# Patient Record
Sex: Female | Born: 1995 | Hispanic: Yes | Marital: Single
Health system: Southern US, Community
[De-identification: ages and names within clinical notes are randomized; demographics above are authoritative.]

---

## 2020-07-05 ENCOUNTER — Other Ambulatory Visit: Payer: Self-pay

## 2021-11-12 ENCOUNTER — Encounter (HOSPITAL_COMMUNITY): Payer: Self-pay | Admitting: Emergency Medicine

## 2021-11-12 ENCOUNTER — Emergency Department (HOSPITAL_COMMUNITY): Payer: Medicaid Other

## 2021-11-12 ENCOUNTER — Other Ambulatory Visit: Payer: Self-pay

## 2021-11-12 ENCOUNTER — Emergency Department (HOSPITAL_COMMUNITY)
Admission: EM | Admit: 2021-11-12 | Discharge: 2021-11-13 | Discharge status: Against Medical Advice | Payer: Medicaid Other | Attending: Emergency Medicine | Admitting: Emergency Medicine

## 2021-11-12 DIAGNOSIS — R059 Cough, unspecified: Secondary | ICD-10-CM | POA: Diagnosis not present

## 2021-11-12 DIAGNOSIS — M25511 Pain in right shoulder: Secondary | ICD-10-CM | POA: Insufficient documentation

## 2021-11-12 DIAGNOSIS — Z5321 Procedure and treatment not carried out due to patient leaving prior to being seen by health care provider: Secondary | ICD-10-CM | POA: Diagnosis not present

## 2021-11-12 DIAGNOSIS — R1011 Right upper quadrant pain: Secondary | ICD-10-CM | POA: Insufficient documentation

## 2021-11-12 LAB — COMPREHENSIVE METABOLIC PANEL
ALT: 12 U/L (ref 0–44)
AST: 17 U/L (ref 15–41)
Albumin: 3.4 g/dL — ABNORMAL LOW (ref 3.5–5.0)
Alkaline Phosphatase: 84 U/L (ref 38–126)
Anion gap: 8 (ref 5–15)
BUN: 8 mg/dL (ref 6–20)
CO2: 23 mmol/L (ref 22–32)
Calcium: 8.8 mg/dL — ABNORMAL LOW (ref 8.9–10.3)
Chloride: 106 mmol/L (ref 98–111)
Creatinine, Ser: 0.71 mg/dL (ref 0.44–1.00)
GFR, Estimated: >60 mL/min (ref >60)
Glucose, Bld: 111 mg/dL — ABNORMAL HIGH (ref 70–99)
Potassium: 3.1 mmol/L — ABNORMAL LOW (ref 3.5–5.1)
Sodium: 137 mmol/L (ref 135–145)
Total Bilirubin: 1 mg/dL (ref 0.3–1.2)
Total Protein: 7.1 g/dL (ref 6.5–8.1)

## 2021-11-12 LAB — CBC WITH DIFFERENTIAL/PLATELET
Abs Immature Granulocytes: 0.05 10*3/uL (ref 0.00–0.07)
Basophils Absolute: 0.1 10*3/uL (ref 0.0–0.1)
Basophils Relative: 0 %
Eosinophils Absolute: 0.3 10*3/uL (ref 0.0–0.5)
Eosinophils Relative: 2 %
HCT: 38.7 % (ref 36.0–46.0)
Hemoglobin: 12.6 g/dL (ref 12.0–15.0)
Immature Granulocytes: 0 %
Lymphocytes Relative: 18 %
Lymphs Abs: 2.3 10*3/uL (ref 0.7–4.0)
MCH: 28.8 pg (ref 26.0–34.0)
MCHC: 32.6 g/dL (ref 30.0–36.0)
MCV: 88.4 fL (ref 80.0–100.0)
Monocytes Absolute: 0.7 10*3/uL (ref 0.1–1.0)
Monocytes Relative: 6 %
Neutro Abs: 9.4 10*3/uL — ABNORMAL HIGH (ref 1.7–7.7)
Neutrophils Relative %: 74 %
Platelets: 293 10*3/uL (ref 150–400)
RBC: 4.38 MIL/uL (ref 3.87–5.11)
RDW: 14.5 % (ref 11.5–15.5)
WBC: 12.9 10*3/uL — ABNORMAL HIGH (ref 4.0–10.5)
nRBC: 0 % (ref 0.0–0.2)

## 2021-11-12 LAB — LIPASE, BLOOD: Lipase: 23 U/L (ref 11–51)

## 2021-11-12 LAB — I-STAT BETA HCG BLOOD, ED (MC, WL, AP ONLY): I-stat hCG, quantitative: <5 m[IU]/mL (ref <5)

## 2021-11-12 NOTE — ED Triage Notes (Signed)
Pt disclosed that she smoked marijuana PTA.  ?

## 2021-11-12 NOTE — ED Provider Triage Note (Signed)
Emergency Medicine Provider Triage Evaluation Note ? ?Julie Singh , a 26 y.o. female  was evaluated in triage.  Pt complains of pain in RUQ/right lower ribs.  States ongoing since this morning but worsening.  Pain worse with movement, deep breathing, coughing, etc.  She denies any vomiting.  States she is never had issues like this before.  She denies any injury.  Also reports coughed up blood this morning.  States she smoked weed earlier because she was in such bad pain. ? ?Review of Systems  ?Positive: RUQ pain, right lower rib pain ?Negative: fever ? ?Physical Exam  ?BP (!) 114/93 (BP Location: Right Arm)   Pulse 78   Temp 98.1 ?F (36.7 ?C) (Oral)   Resp 16   SpO2 99%  ?Gen:   Awake, no distress   ?Resp:  Normal effort  ?MSK:   Moves extremities without difficulty  ?Other:  Tender along right lower ribs, some mild tenderness in RUQ as well-- difficult exam as she remained hunched over. ? ?Medical Decision Making  ?Medically screening exam initiated at 10:20 PM.  Appropriate orders placed.  Vashawn Riggen was informed that the remainder of the evaluation will be completed by another provider, this initial triage assessment does not replace that evaluation, and the importance of remaining in the ED until their evaluation is complete. ? ?RUQ/right lower rib pain.  Exam difficult in triage as she remains hunched over in chair.  Will check labs, [place], rib films. ?  ?Larene Pickett, PA-C ?11/12/21 2223 ? ?

## 2021-11-12 NOTE — ED Triage Notes (Signed)
Pt reported to ED with c/o pain to RUQ abdomen under the last rib. Pt stats pain increases with movement. Pt states she is also having pain to right shoulder, has hx of frequent dislocations. Pt also states she has blood in phlegm.  ?

## 2021-11-13 LAB — URINALYSIS, ROUTINE W REFLEX MICROSCOPIC
Bilirubin Urine: NEGATIVE
Glucose, UA: NEGATIVE mg/dL
Hgb urine dipstick: NEGATIVE
Ketones, ur: NEGATIVE mg/dL
Nitrite: NEGATIVE
Protein, ur: NEGATIVE mg/dL
Specific Gravity, Urine: 1.021 (ref 1.005–1.030)
pH: 6 (ref 5.0–8.0)

## 2021-11-13 NOTE — ED Notes (Signed)
Patient left on own accord °

## 2023-05-22 IMAGING — US US ABDOMEN LIMITED
1 series · 14 of 25 positions shown · non-contrast
Comparison: None Available.

CLINICAL DATA: Right upper quadrant abdominal pain

EXAM:
ULTRASOUND ABDOMEN LIMITED RIGHT UPPER QUADRANT

[Series 1: us abdomen limited ruq (liver/gb) · 14 of 62 slices shown]
[im 1/62]
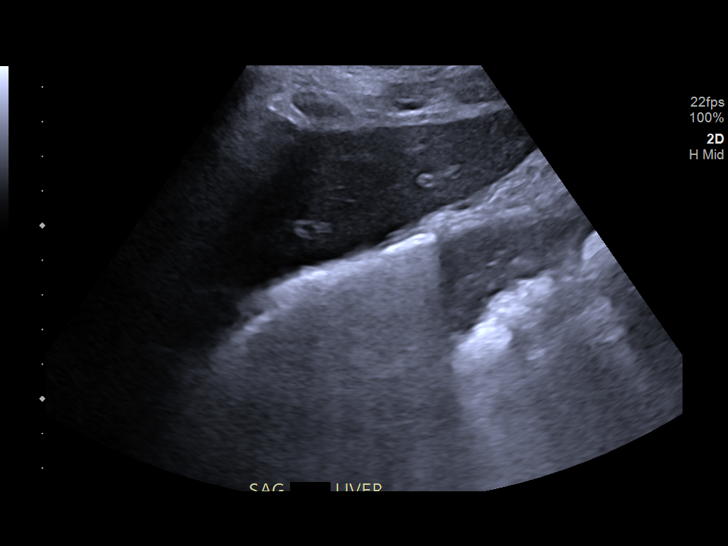
[im 6/62]
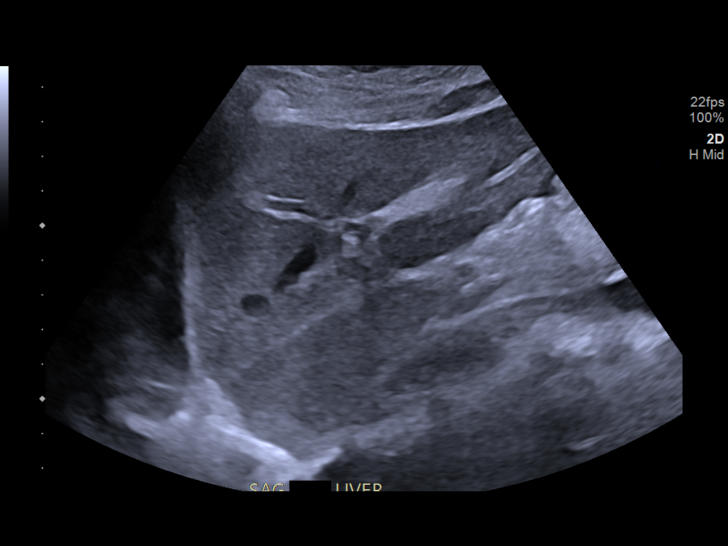
[im 11/62]
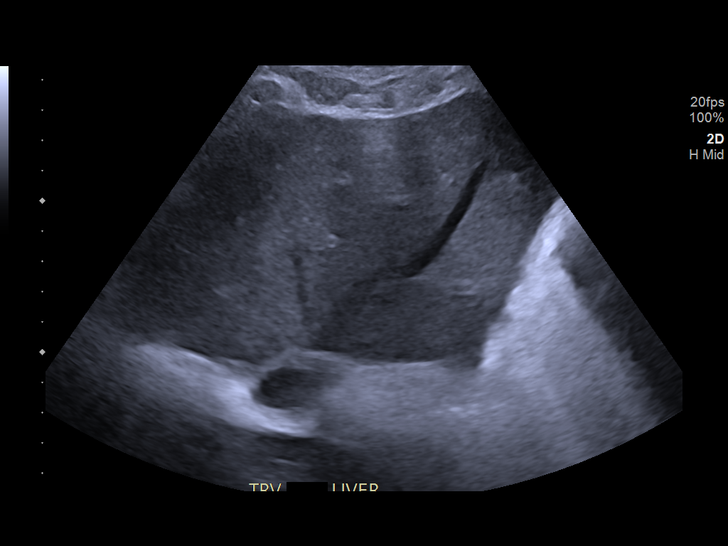
[im 16/62]
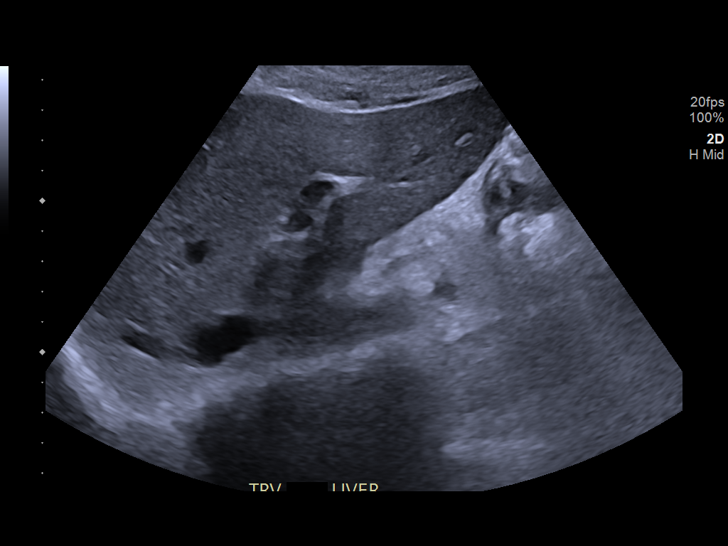
[im 21/62]
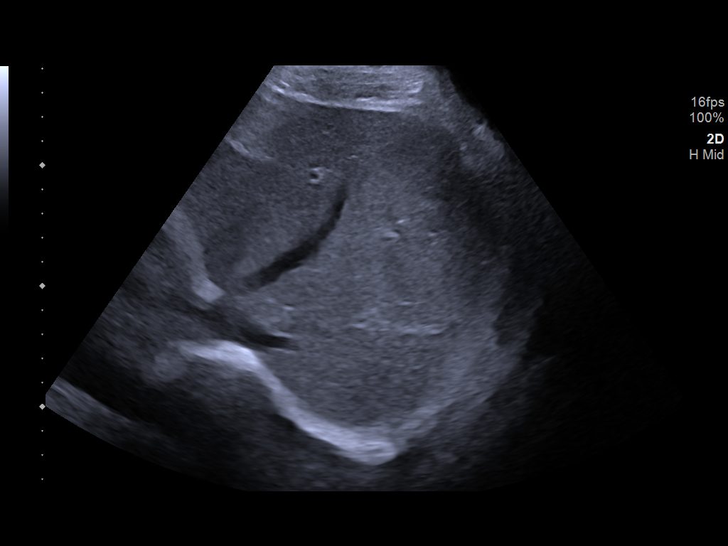
[im 23/62]
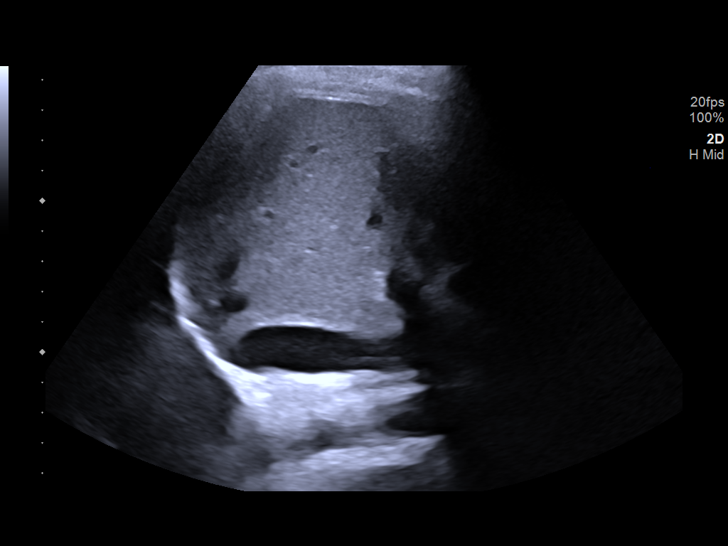
[im 28/62]
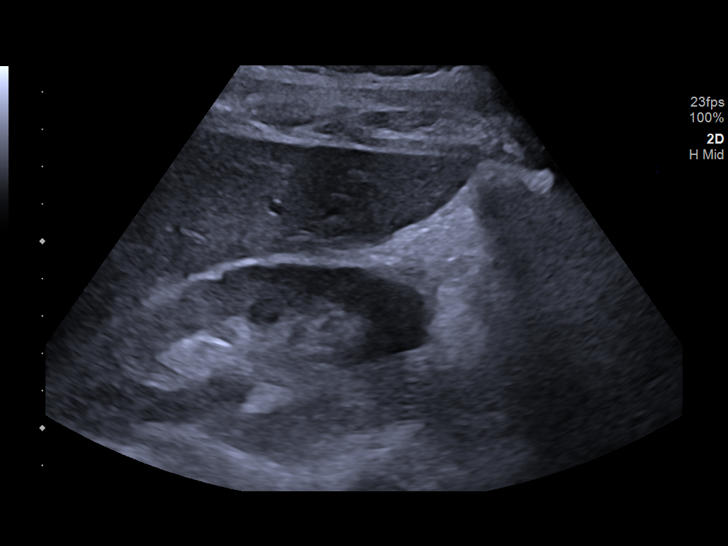
[im 34/62]
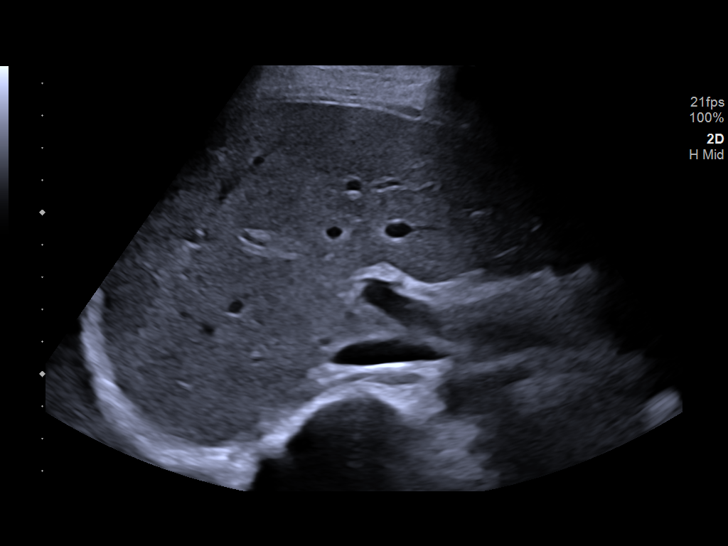
[im 39/62]
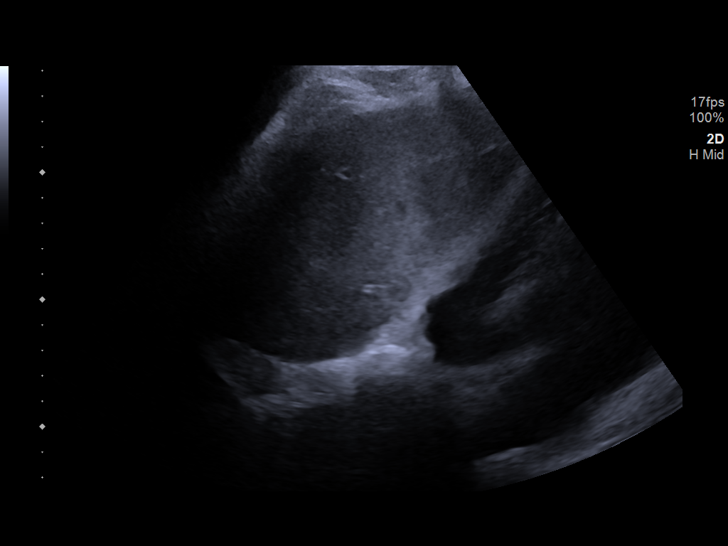
[im 41/62]
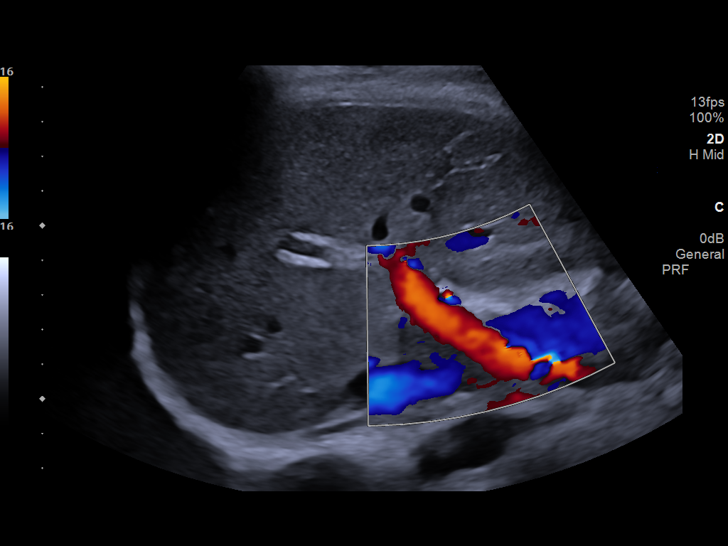
[im 46/62]
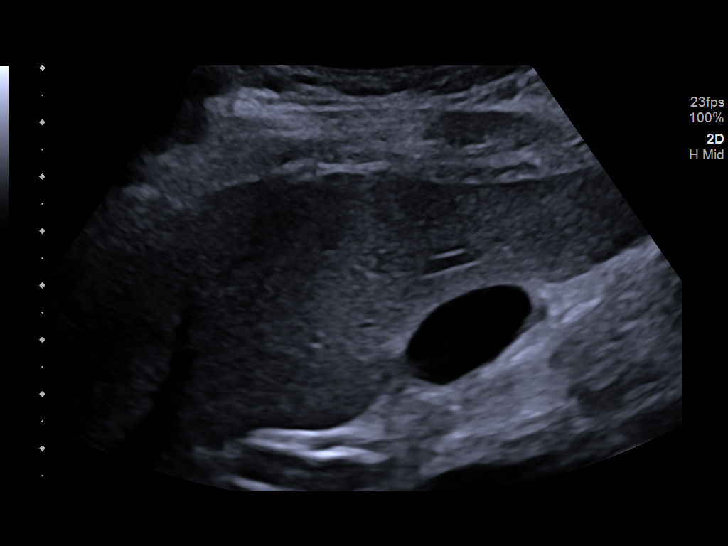
[im 51/62]
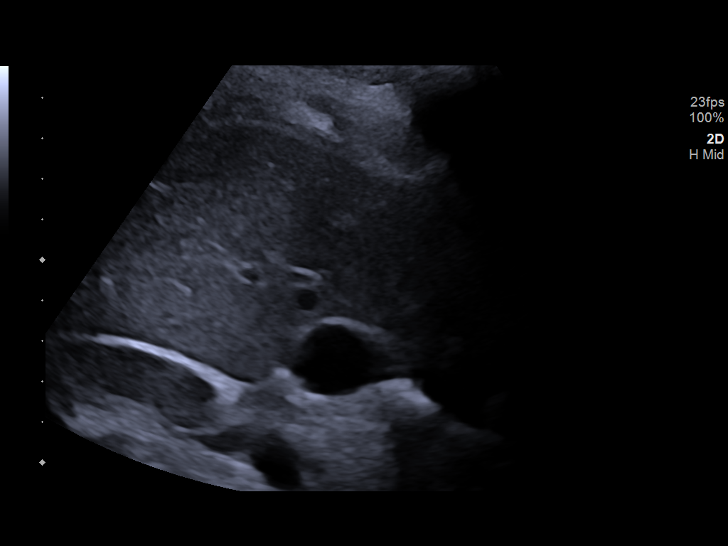
[im 56/62]
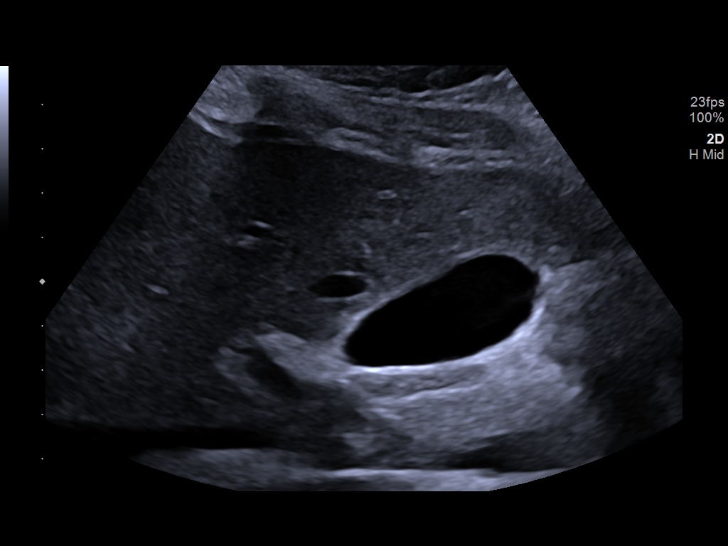
[im 62/62]
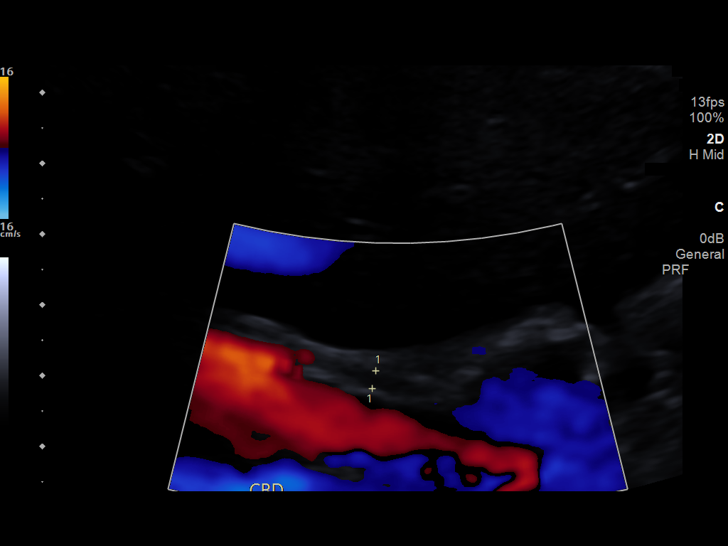

[14 of 25 positions shown; findings below may reference images not displayed]

FINDINGS: Gallbladder:

No gallstones, gallbladder wall thickening, or pericholecystic
fluid. Negative sonographic Murphy's sign.

Common bile duct:

Diameter: 3 mm

Liver:

No focal lesion identified. Within normal limits in parenchymal
echogenicity. Portal vein is patent on color Doppler imaging with
normal direction of blood flow towards the liver.

Other: None.
IMPRESSION: Negative right upper quadrant ultrasound.

## 2023-05-22 IMAGING — DX DG RIBS W/ CHEST 3+V*R*
3 series · 3 of 3 positions shown · non-contrast
Comparison: None Available.

CLINICAL DATA: Rib pain

EXAM:
RIGHT RIBS AND CHEST - 3+ VIEW

[chest pa]
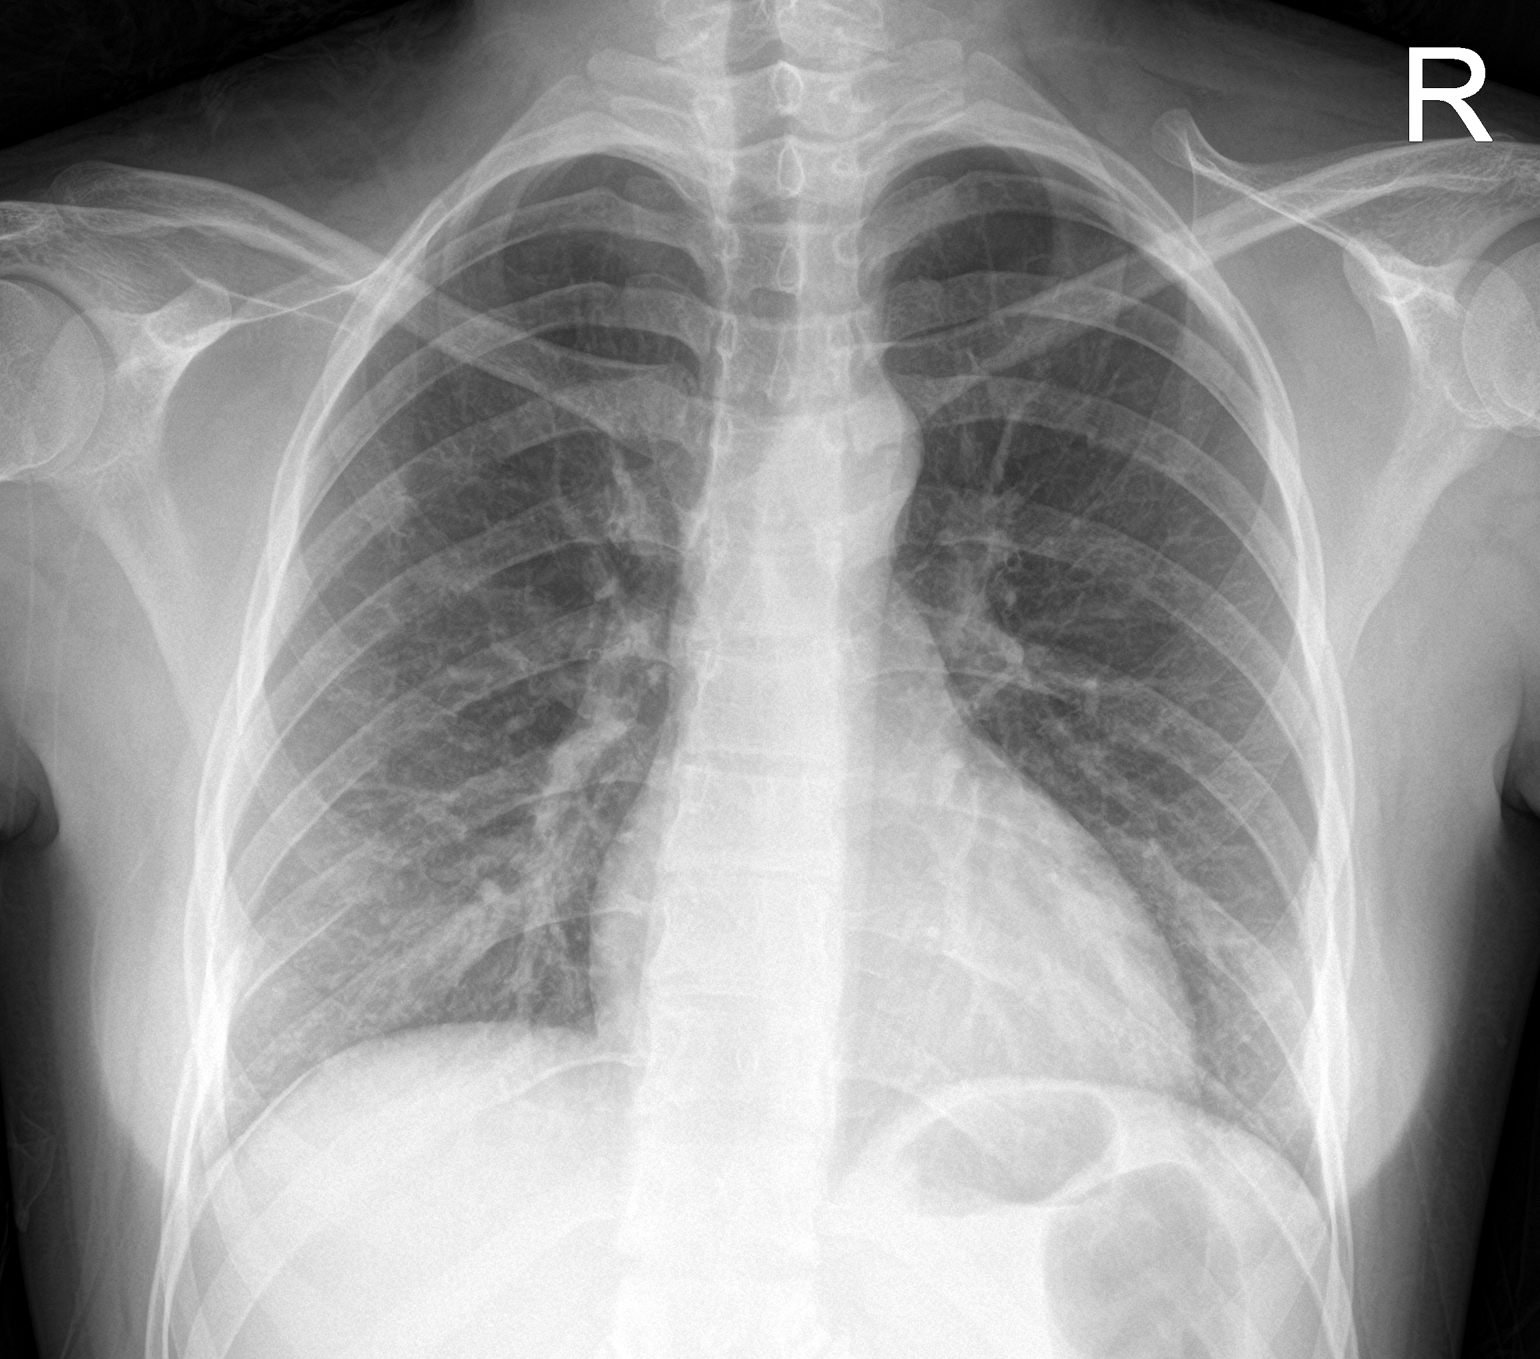

[rib ap]
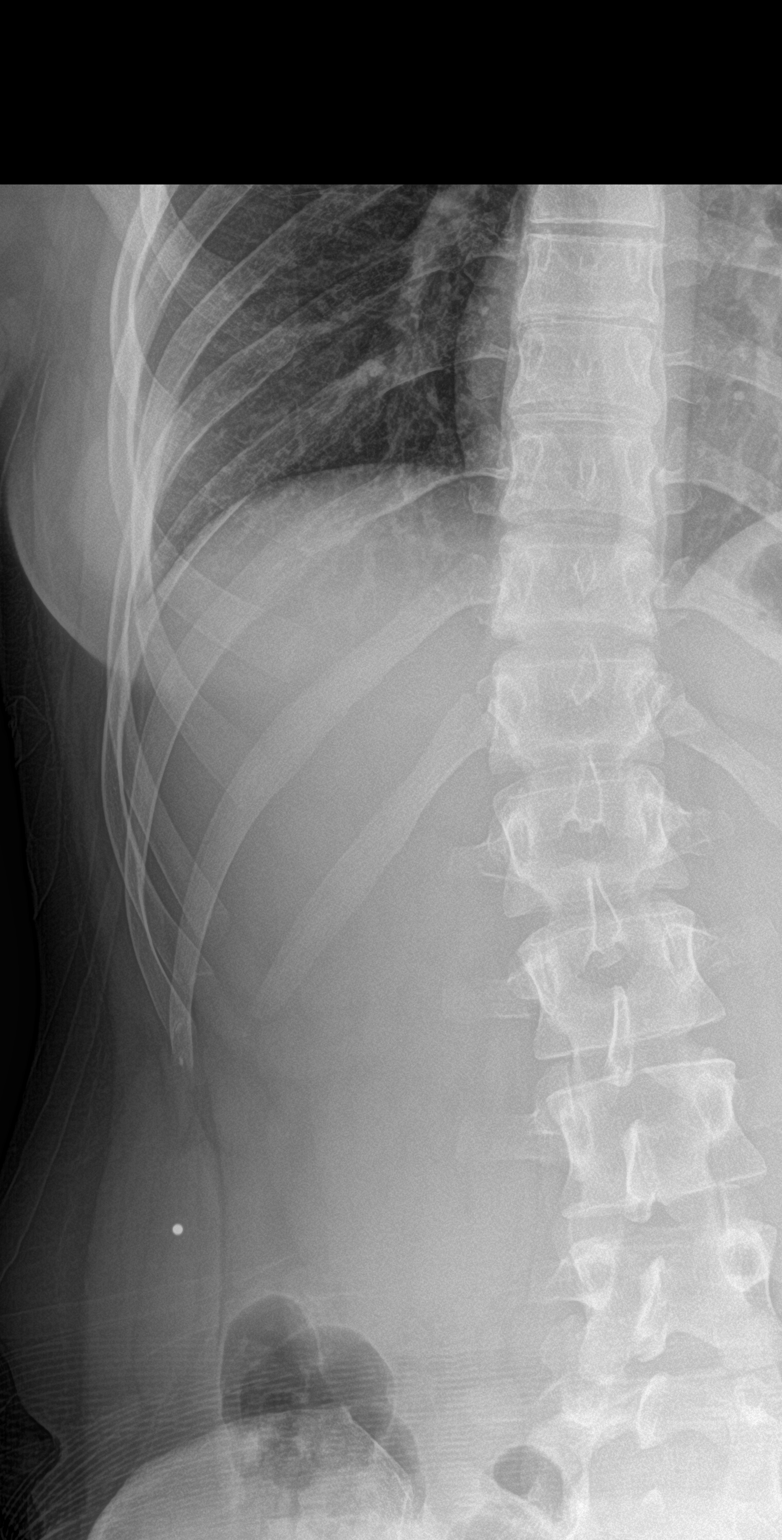

[rib ap obl]
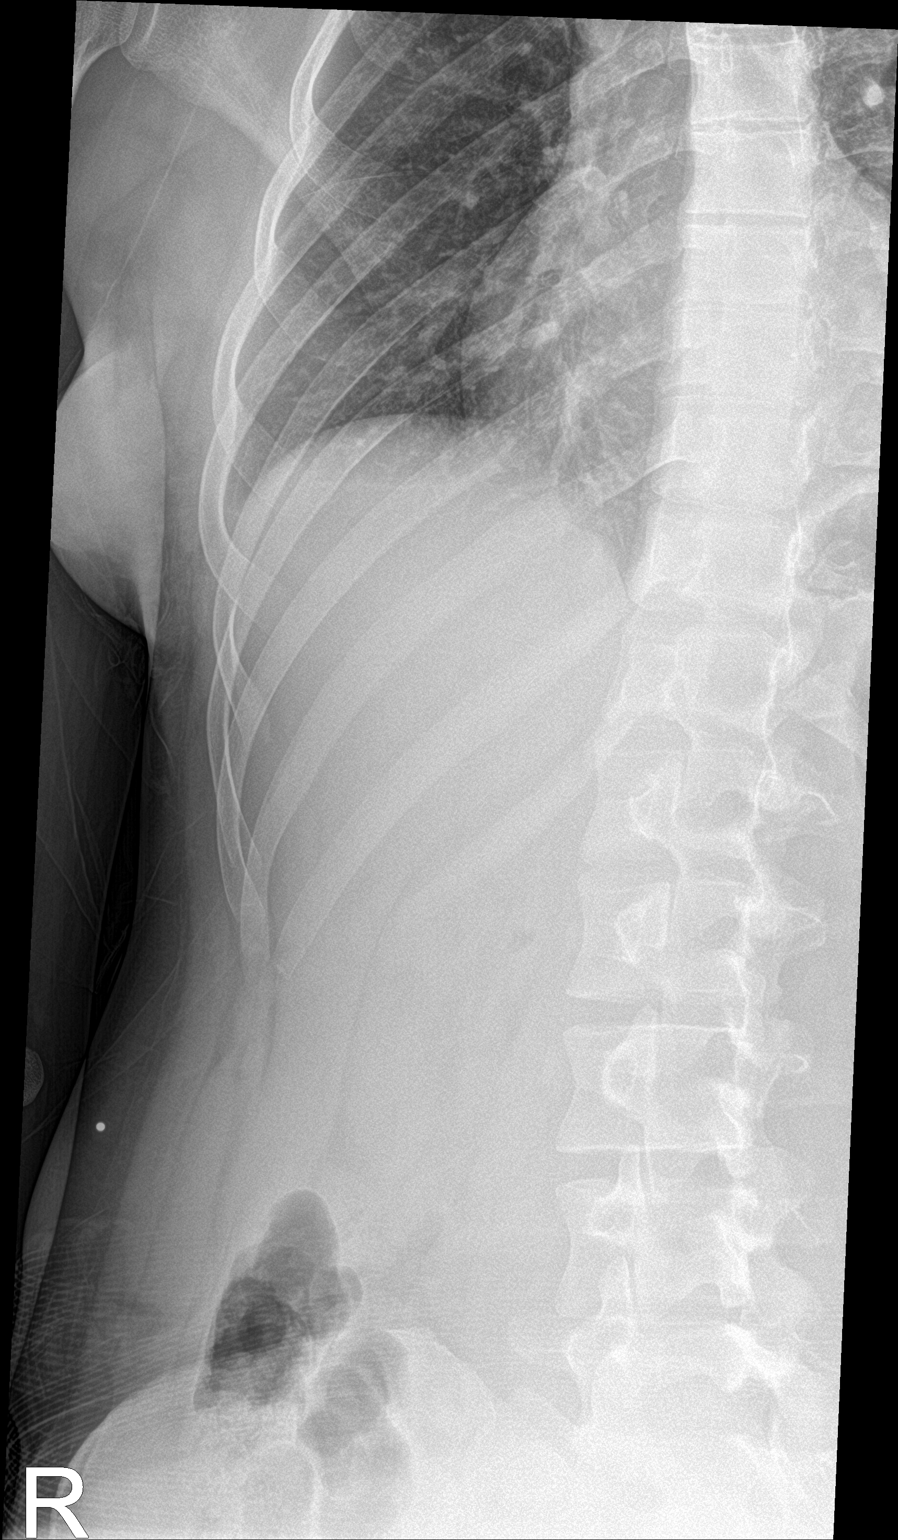

[3 of 3 positions shown; findings below may reference images not displayed]

FINDINGS: No fracture or other bone lesions are seen involving the ribs. There
is no evidence of pneumothorax or pleural effusion. Both lungs are
clear. Heart size and mediastinal contours are within normal limits.
IMPRESSION: Negative.
